# Patient Record
Sex: Male | Born: 1950 | Race: White | Hispanic: No | Marital: Single | Smoking: Former smoker
Health system: Southern US, Community
[De-identification: ages and names within clinical notes are randomized; demographics above are authoritative.]

## PROBLEM LIST (undated history)

## (undated) DIAGNOSIS — E119 Type 2 diabetes mellitus without complications: Secondary | ICD-10-CM

## (undated) DIAGNOSIS — E785 Hyperlipidemia, unspecified: Secondary | ICD-10-CM

## (undated) DIAGNOSIS — G4730 Sleep apnea, unspecified: Secondary | ICD-10-CM

## (undated) DIAGNOSIS — E669 Obesity, unspecified: Secondary | ICD-10-CM

## (undated) DIAGNOSIS — K219 Gastro-esophageal reflux disease without esophagitis: Secondary | ICD-10-CM

## (undated) HISTORY — DX: Sleep apnea, unspecified: G47.30

## (undated) HISTORY — DX: Type 2 diabetes mellitus without complications: E11.9

## (undated) HISTORY — DX: Hyperlipidemia, unspecified: E78.5

## (undated) HISTORY — DX: Gastro-esophageal reflux disease without esophagitis: K21.9

## (undated) HISTORY — PX: NECK SURGERY: SHX720

## (undated) HISTORY — DX: Obesity, unspecified: E66.9

## (undated) HISTORY — PX: LEG SURGERY: SHX1003

## (undated) HISTORY — PX: HAND SURGERY: SHX662

---

## 2013-02-25 ENCOUNTER — Encounter: Payer: 59 | Admitting: Dietician

## 2013-02-25 ENCOUNTER — Encounter: Payer: 59 | Attending: Emergency Medicine | Admitting: Dietician

## 2013-02-25 VITALS — Ht 67.0 in | Wt 216.3 lb

## 2013-02-25 DIAGNOSIS — Z713 Dietary counseling and surveillance: Secondary | ICD-10-CM | POA: Insufficient documentation

## 2013-02-25 DIAGNOSIS — E119 Type 2 diabetes mellitus without complications: Secondary | ICD-10-CM | POA: Insufficient documentation

## 2013-02-27 ENCOUNTER — Encounter: Payer: Self-pay | Admitting: Dietician

## 2013-02-27 NOTE — Progress Notes (Signed)
  Medical Nutrition Therapy:  Appt start time: 0800 end time:  0830.   Assessment:  Primary concerns today: Comes today for blood glucose monitoring.  New Onset DM Type 2 Provided and Accu-Chek [employer] meter Kit. Lot [id number] Exp: 04/27/2014 Completed review of Blood Glucose Monitoring Process. On return demonstration his blood glucose fasting but after 1 cup black coffee with light milk was 190 mg/dl. Advised to call MD office for prescription for strips and lancets. Recommended he monitor glucose levels 2 times per day.  (vary testing times between: fasting, 2 hour post-meal (2 hours after 1 st bite of a Meal) and bedtime.  Record blood glucose levels and take glucose log to all MD/clinic appointments.   Progress Towards Goal(s):  No progress.   Nutritional Diagnosis:  [place]-2.1 Inpaired nutrition utilization As related to glucose.  As evidenced by new diagnosis of type 2 diabetes with early AM glucose at 190 mg/dl and E4V at 4.0%.    Intervention:  BGM education.  Handouts given during visit include:  Accu Chek Glucose meter  Monitoring/Evaluation:  Dietary intake, exercise, blood glucose readings, and body weight at [facility] DM Core Class 2 or 1:1 follow-up.

## 2013-02-27 NOTE — Progress Notes (Signed)
Patient was seen on 02/25/2013 for the first of a series of three diabetes self-management courses at [facility]. Patient's most recent A1c was 8.4 % on 02/05/2013 The following learning objectives were met by the patient during this course:   Defines the role of glucose and insulin  Identifies type of diabetes and pathophysiology  Defines the diagnostic criteria for diabetes and prediabetes  States the risk factors for Type 2 Diabetes  States the symptoms of Type 2 Diabetes  Defines Type 2 Diabetes treatment goals  Defines Type 2 Diabetes treatment options  States the rationale for glucose monitoring  Identifies A1C, glucose targets, and testing times  Identifies proper sharps disposal  Defines the purpose of a diabetes food plan  Identifies carbohydrate food groups  Defines effects of carbohydrate foods on glucose levels  Identifies carbohydrate choices/grams/food labels  States benefits of physical activity and effect on glucose  Review of suggested activity guidelines  Handouts given during class include:  Type 2 Diabetes: Basics Book  My Food Plan Book  Food and Activity Log   Follow-Up Plan: Core Class 2  If his insurance will pay for classes.  Will work with him 1:1 if needed.

## 2014-11-27 ENCOUNTER — Other Ambulatory Visit: Payer: Self-pay | Admitting: Gastroenterology

## 2014-11-27 DIAGNOSIS — R1013 Epigastric pain: Secondary | ICD-10-CM

## 2014-12-04 ENCOUNTER — Ambulatory Visit
Admission: RE | Admit: 2014-12-04 | Discharge: 2014-12-04 | Disposition: A | Discharge status: Home / Self Care | Payer: Medicare Other | Source: Ambulatory Visit | Attending: Gastroenterology | Admitting: Gastroenterology

## 2014-12-04 DIAGNOSIS — R1013 Epigastric pain: Secondary | ICD-10-CM

## 2017-04-10 ENCOUNTER — Other Ambulatory Visit (HOSPITAL_BASED_OUTPATIENT_CLINIC_OR_DEPARTMENT_OTHER): Payer: Self-pay

## 2017-04-10 DIAGNOSIS — R454 Irritability and anger: Secondary | ICD-10-CM

## 2017-04-10 DIAGNOSIS — R0683 Snoring: Secondary | ICD-10-CM

## 2017-04-10 DIAGNOSIS — R5383 Other fatigue: Secondary | ICD-10-CM

## 2017-04-10 DIAGNOSIS — G4710 Hypersomnia, unspecified: Secondary | ICD-10-CM

## 2017-05-17 ENCOUNTER — Ambulatory Visit (HOSPITAL_BASED_OUTPATIENT_CLINIC_OR_DEPARTMENT_OTHER): Payer: Medicare Other | Attending: Nurse Practitioner | Admitting: Internal Medicine

## 2017-05-17 VITALS — Ht 67.0 in | Wt 210.0 lb

## 2017-05-17 DIAGNOSIS — R0683 Snoring: Secondary | ICD-10-CM | POA: Insufficient documentation

## 2017-05-17 DIAGNOSIS — R454 Irritability and anger: Secondary | ICD-10-CM

## 2017-05-17 DIAGNOSIS — R5383 Other fatigue: Secondary | ICD-10-CM | POA: Insufficient documentation

## 2017-05-17 DIAGNOSIS — G4733 Obstructive sleep apnea (adult) (pediatric): Secondary | ICD-10-CM

## 2017-05-17 DIAGNOSIS — G4710 Hypersomnia, unspecified: Secondary | ICD-10-CM | POA: Diagnosis present

## 2017-05-25 NOTE — Procedures (Signed)
Patient Name: Darryl Mosley, Darryl Mosley Date: 05/17/2017 Gender: Male D.O.B: 09/10/1951 Age (years): 65 Referring Provider: Joyce Copa Height (inches): 34 Interpreting Physician: Baird Lyons MD, ABSM Weight (lbs): 210 RPSGT: Madelon Lips BMI: 33 MRN: [id number] Neck Size: 17.00 CLINICAL INFORMATION Sleep Study Type: Split Night CPAP  Indication for sleep study: Excessive Daytime Sleepiness, Fatigue, Snoring  Epworth Sleepiness Score: 2  SLEEP STUDY TECHNIQUE As per the AASM Manual for the Scoring of Sleep and Associated Events v2.3 (April 2016) with a hypopnea requiring 4% desaturations.  The channels recorded and monitored were frontal, central and occipital EEG, electrooculogram (EOG), submentalis EMG (chin), nasal and oral airflow, thoracic and abdominal wall motion, anterior tibialis EMG, snore microphone, electrocardiogram, and pulse oximetry. Continuous positive airway pressure (CPAP) was initiated when the patient met split night criteria and was titrated according to treat sleep-disordered breathing.  MEDICATIONS Medications self-administered by patient taken the night of the study : none reported  RESPIRATORY PARAMETERS Diagnostic  Total AHI (/hr): 30.0 RDI (/hr): 43.6 OA Index (/hr): 9.1 CA Index (/hr): 0.3 REM AHI (/hr): 49.4 NREM AHI (/hr): 14.4 Supine AHI (/hr): 60.0 Non-supine AHI (/hr): 26.89 Min O2 Sat (%): 77.00 Mean O2 (%): 91.70 Time below 88% (min): 23.8   Titration  Optimal Pressure (cm): 8 AHI at Optimal Pressure (/hr): 0.0 Min O2 at Optimal Pressure (%): 91.0 Supine % at Optimal (%): 0 Sleep % at Optimal (%): 95   SLEEP ARCHITECTURE The recording time for the entire night was 406.9 minutes.  During a baseline period of 224.9 minutes, the patient slept for 172.2 minutes in REM and nonREM, yielding a sleep efficiency of 76.6%. Sleep onset after lights out was 11.7 minutes with a REM latency of 108.0 minutes. The patient spent 20.61% of the night in  stage N1 sleep, 34.96% in stage N2 sleep, 0.00% in stage N3 and 44.42% in REM.  During the titration period of 172.8 minutes, the patient slept for 148.5 minutes in REM and nonREM, yielding a sleep efficiency of 85.9%. Sleep onset after CPAP initiation was 6.1 minutes with a REM latency of 72.0 minutes. The patient spent 14.81% of the night in stage N1 sleep, 69.36% in stage N2 sleep, 0.00% in stage N3 and 15.82% in REM.  CARDIAC DATA The 2 lead EKG demonstrated sinus rhythm. The mean heart rate was 71.73 beats per minute. Other EKG findings include: None.  LEG MOVEMENT DATA The total Periodic Limb Movements of Sleep (PLMS) were 32. The PLMS index was 5.94 .  IMPRESSIONS - Severe obstructive sleep apnea occurred during the diagnostic portion of the study (AHI = 30.0/hour). An optimal PAP pressure was selected for this patient ( 8 cm of water) - No significant central sleep apnea occurred during the diagnostic portion of the study (CAI = 0.3/hour). - Moderate oxygen desaturation was noted during the diagnostic portion of the study (Min O2 =77.00%). - The patient snored with Loud snoring volume during the diagnostic portion of the study. - No cardiac abnormalities were noted during this study. - Mild periodic limb movements of sleep occurred during the study.  DIAGNOSIS - Obstructive Sleep Apnea (327.23 [G47.33 ICD-10])  RECOMMENDATIONS - Trial of CPAP therapy on 8 cm H2O with a Medium size Resmed Nasal Mask Airfit N20 mask and heated humidification. - Avoid alcohol, sedatives and other CNS depressants that may worsen sleep apnea and disrupt normal sleep architecture. - Sleep hygiene should be reviewed to assess factors that may improve sleep quality. - Weight management and regular  exercise should be initiated or continued.  [Electronically signed] 07/01/2017 09:35 AM  Baird Lyons MD, ABSM Diplomate, American Board of Sleep Medicine   NPI: [id number]  [place],  American Board of Sleep Medicine  ELECTRONICALLY SIGNED ON:  05/25/2017, 9:26 AM [place] PH: [phone]   FX: [phone] [place]

## 2017-07-01 DIAGNOSIS — G4733 Obstructive sleep apnea (adult) (pediatric): Secondary | ICD-10-CM | POA: Diagnosis not present

## 2017-09-02 ENCOUNTER — Encounter: Payer: Self-pay | Admitting: Pulmonary Disease

## 2017-09-02 ENCOUNTER — Ambulatory Visit (INDEPENDENT_AMBULATORY_CARE_PROVIDER_SITE_OTHER): Payer: Medicare Other | Admitting: Pulmonary Disease

## 2017-09-02 DIAGNOSIS — R05 Cough: Secondary | ICD-10-CM | POA: Diagnosis not present

## 2017-09-02 DIAGNOSIS — G4733 Obstructive sleep apnea (adult) (pediatric): Secondary | ICD-10-CM | POA: Insufficient documentation

## 2017-09-02 DIAGNOSIS — Z9989 Dependence on other enabling machines and devices: Secondary | ICD-10-CM

## 2017-09-02 DIAGNOSIS — R053 Chronic cough: Secondary | ICD-10-CM | POA: Insufficient documentation

## 2017-09-02 MED ORDER — PANTOPRAZOLE SODIUM 40 MG PO TBEC: 40.0000 mg | DELAYED_RELEASE_TABLET | Freq: Every day | ORAL | 2 refills | Status: DC

## 2017-09-02 NOTE — Assessment & Plan Note (Signed)
cough may be related to reflux or to sinus drip  Protonix 40 mg daily for 6 weeks Schedule breathing test -full PFTs. Schedule swallowing test to look for stricture/narrowing of your food pipe  If limited improvement in cough, will sequentially treat for postnasal drip

## 2017-09-02 NOTE — Patient Instructions (Signed)
Your cough may be related to reflux or to sinus drip  Protonix 40 mg daily for 6 weeks Schedule breathing test -full PFTs. Schedule swallowing test to look for stricture/narrowing of your food pipe
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
## 2017-09-02 NOTE — Assessment & Plan Note (Signed)
CPAP 8 cm seems to be working well He does have residual snoring and may have to increase pressure  Weight loss encouraged, compliance with goal of at least 4-6 hrs every night is the expectation. Advised against medications with sedative side effects Cautioned against driving when sleepy - understanding that sleepiness will vary on a day to day basis

## 2017-09-08 ENCOUNTER — Ambulatory Visit (HOSPITAL_COMMUNITY)
Admission: RE | Admit: 2017-09-08 | Discharge: 2017-09-08 | Disposition: A | Discharge status: Home / Self Care | Payer: Medicare Other | Source: Ambulatory Visit | Attending: Pulmonary Disease | Admitting: Pulmonary Disease

## 2017-09-08 DIAGNOSIS — K449 Diaphragmatic hernia without obstruction or gangrene: Secondary | ICD-10-CM | POA: Insufficient documentation

## 2017-09-08 DIAGNOSIS — R1310 Dysphagia, unspecified: Secondary | ICD-10-CM | POA: Insufficient documentation

## 2017-09-08 DIAGNOSIS — K219 Gastro-esophageal reflux disease without esophagitis: Secondary | ICD-10-CM | POA: Diagnosis not present

## 2017-09-08 DIAGNOSIS — R053 Chronic cough: Secondary | ICD-10-CM

## 2017-09-08 DIAGNOSIS — R05 Cough: Secondary | ICD-10-CM | POA: Insufficient documentation

## 2017-09-22 ENCOUNTER — Institutional Professional Consult (permissible substitution): Payer: Medicare Other | Admitting: Internal Medicine

## 2017-10-20 IMAGING — RF DG ESOPHAGUS
13 series · 16 of 24 positions shown · non-contrast
Comparison: None.

CLINICAL DATA: Chronic cough for 2 years. History of pneumonia.
Diabetes. Gastroesophageal reflux disease. Ex-smoker.

EXAM:
ESOPHOGRAM / BARIUM SWALLOW / BARIUM TABLET STUDY
TECHNIQUE: Combined double contrast and single contrast examination performed
using effervescent crystals, thick barium liquid, and thin barium
liquid. The patient was observed with fluoroscopy swallowing a 13 mm
barium sulphate tablet.
FLUOROSCOPY TIME:  Fluoroscopy Time:  3 minutes and 18 seconds
Radiation Exposure Index (if provided by the fluoroscopic device):
Not applicable.
Number of Acquired Spot Images: 0

[Series 1: cp_standard · 0.17mm/px · 1 of 1 slices shown (1 of 13)]
[im 1/1]
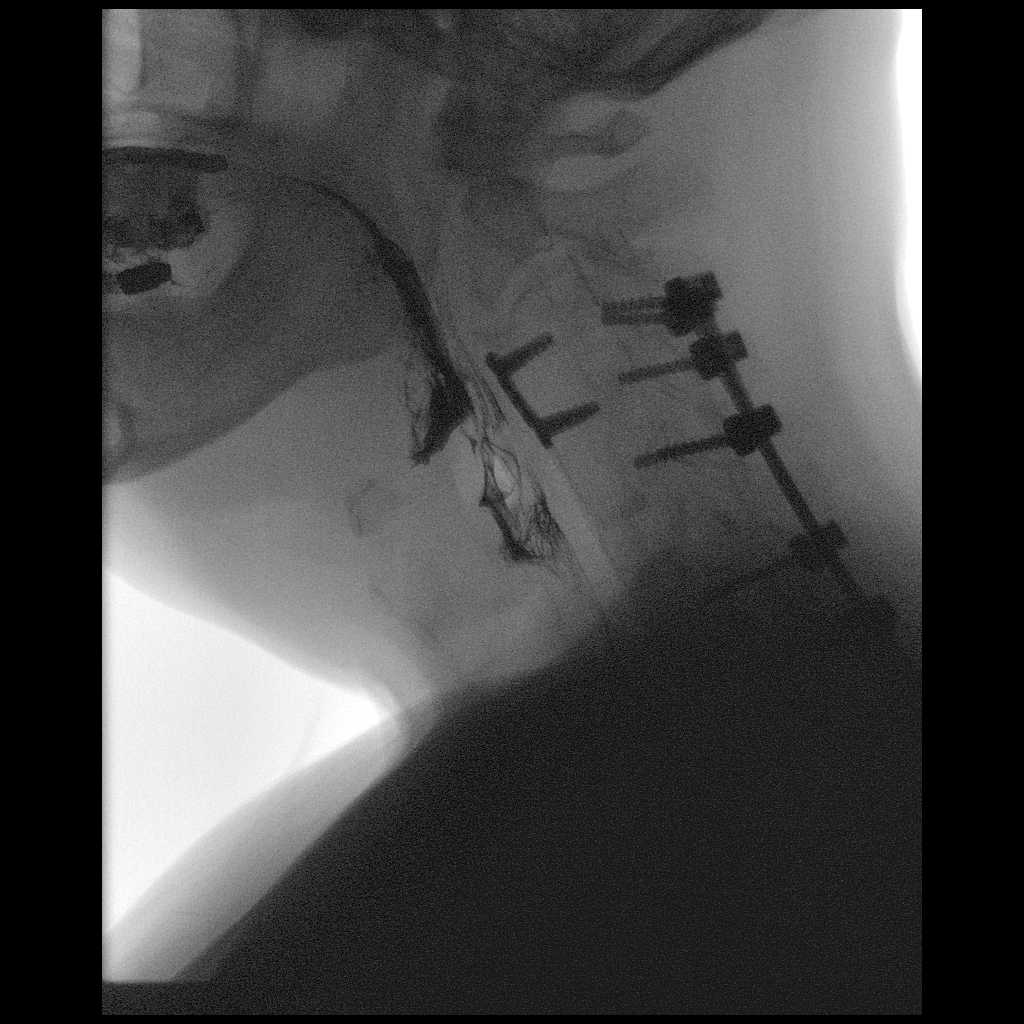

[Series 2: cp_standard · 0.51mm/px · 1 of 35 frames shown (2 of 13)]
[frame 34/35]
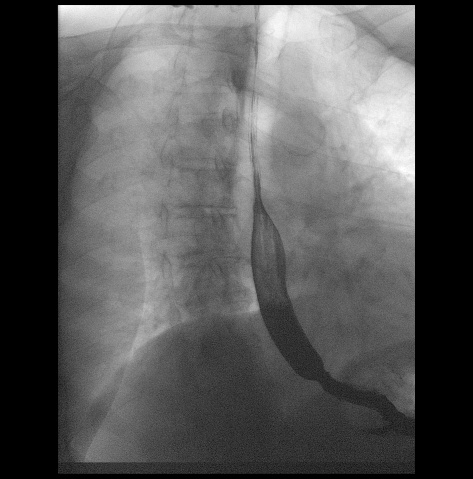

[Series 3: cp_standard · 0.51mm/px · 1 of 99 frames shown (3 of 13)]
[frame 24/99]
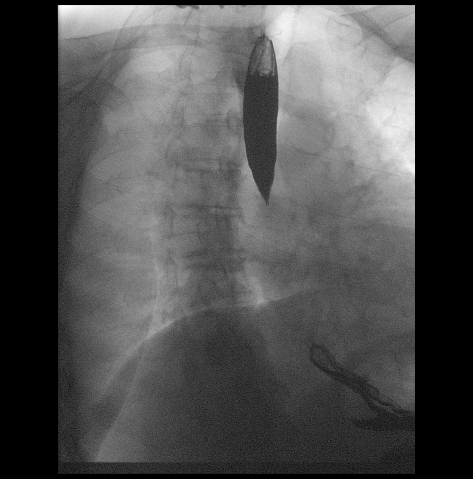

[Series 4: cp_standard · 0.51mm/px · 2 of 152 frames shown (4 of 13)]
[frame 77/152]
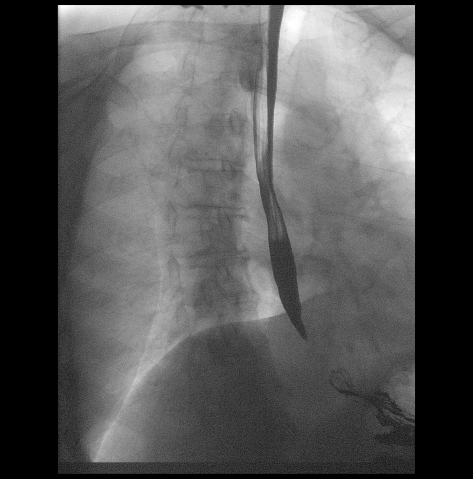
[frame 130/152]
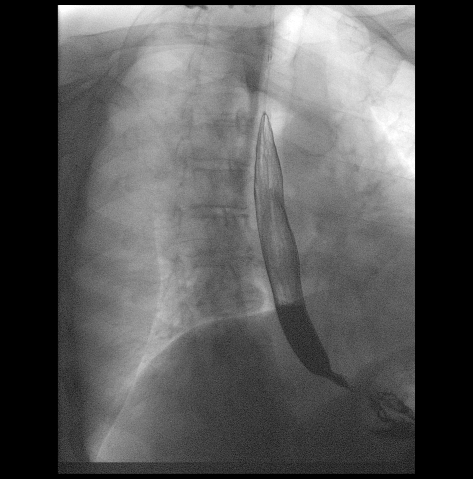

[Series 5: cp_standard · 0.52mm/px · 1 of 120 frames shown (5 of 13)]
[frame 108/120]
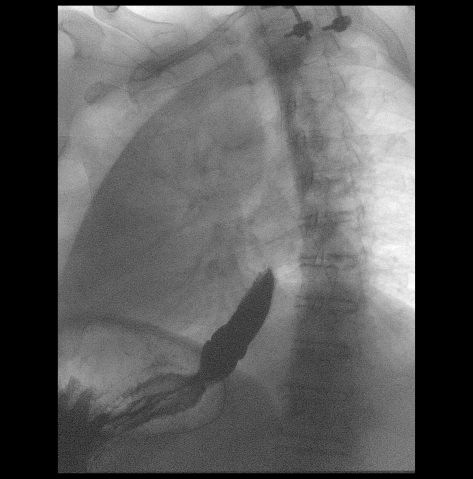

[Series 6: cp_standard · 0.52mm/px · 1 of 181 frames shown (6 of 13)]
[frame 91/181]
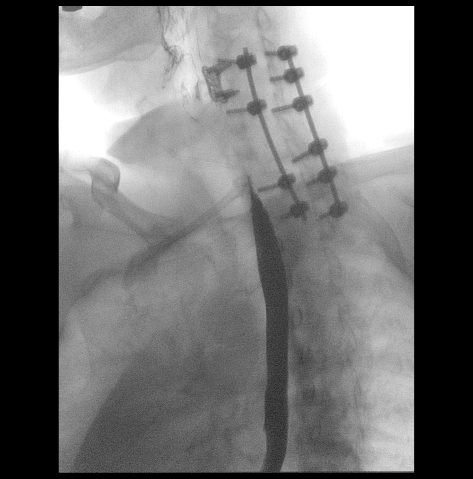

[Series 7: cp_standard · 0.52mm/px · 2 of 277 frames shown (7 of 13)]
[frame 139/277]
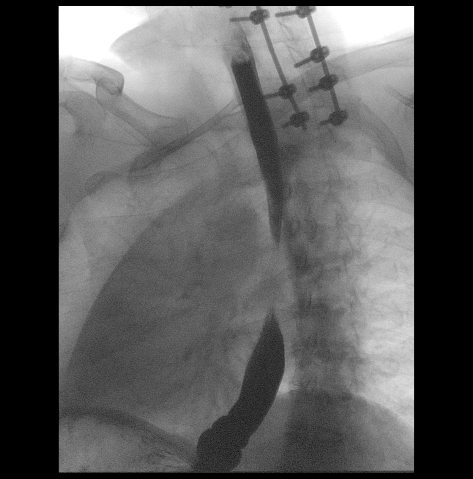
[frame 236/277]
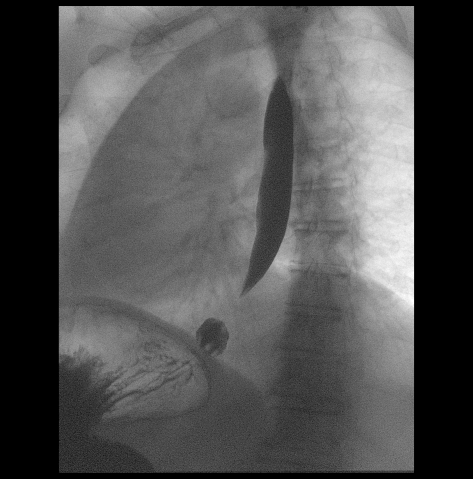

[Series 8: cp_standard · 0.53mm/px · 1 of 45 frames shown (8 of 13)]
[frame 45/45]
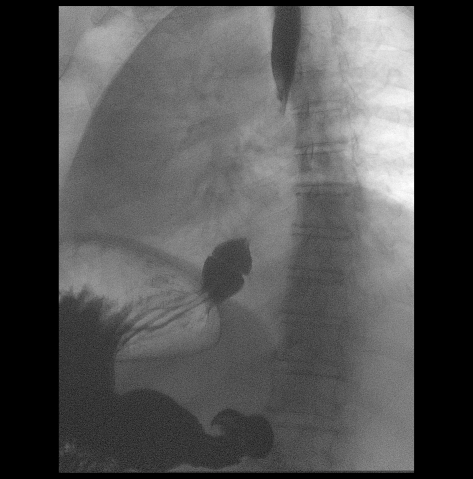

[Series 9: cp_standard · 0.53mm/px · 1 of 91 frames shown (9 of 13)]
[frame 15/91]
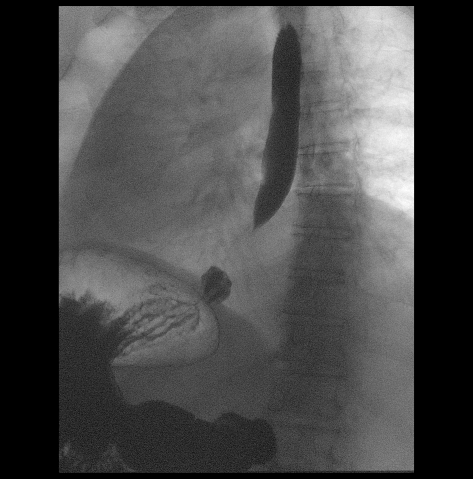

[Series 10: cp_standard · 0.53mm/px · 2 of 221 frames shown (10 of 13)]
[frame 87/221]
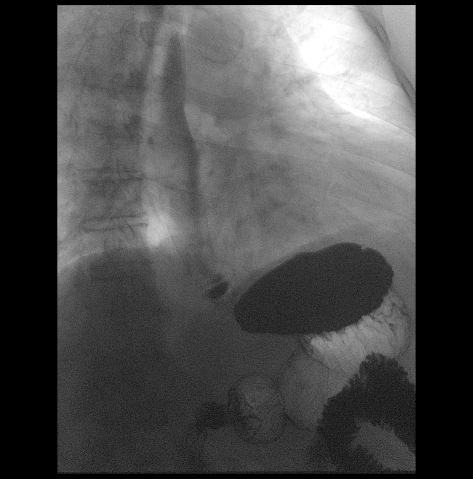
[frame 188/221]
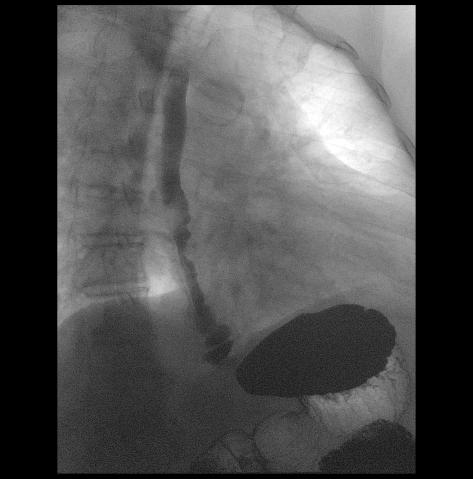

[Series 11: cp_standard · 0.53mm/px · 1 of 57 frames shown (11 of 13)]
[frame 49/57]
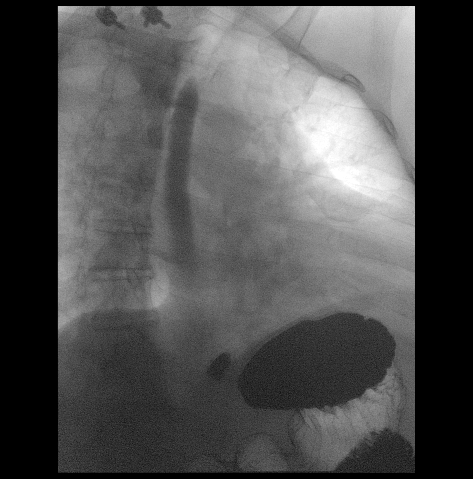

[Series 12: cp_standard · 0.53mm/px · 1 of 242 frames shown (12 of 13)]
[frame 37/242]
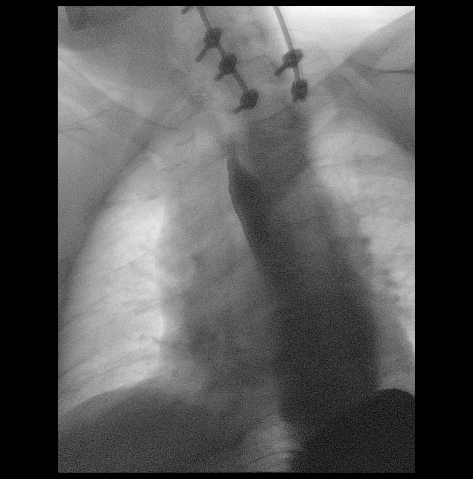

[Series 14: cp_standard · 0.25mm/px · 1 of 1 slices shown (13 of 13)]
[im 1/1]
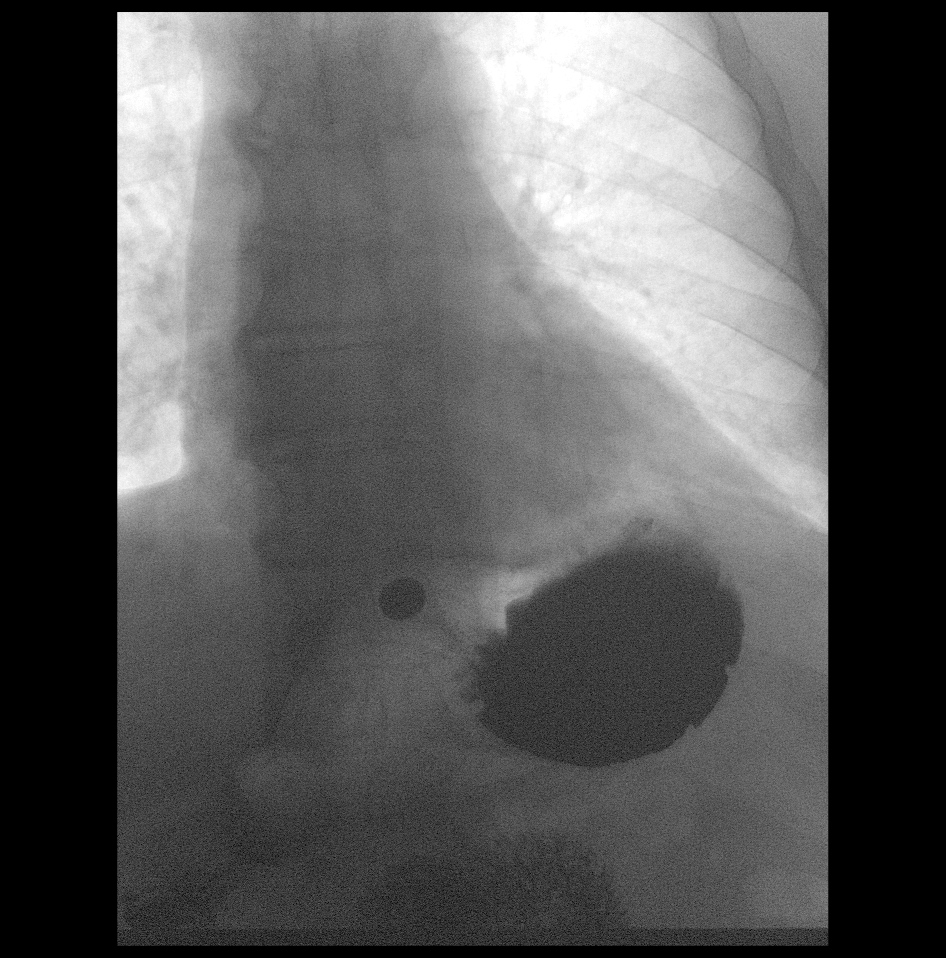

[16 of 24 positions shown; findings below may reference images not displayed]

FINDINGS: Hypopharyngeal portion of the exam is unremarkable.

Double contrast evaluation of the esophagus demonstrates no mucosal
abnormality.

Evaluation of primary peristalsis demonstrates a normal primary
peristaltic wave on each swallow.

Small hiatal hernia.

With water siphon test, there is gastroesophageal reflux to the
upper thoracic esophagus. Example series 10.

Delayed passage of a barium tablet within the distal esophagus.
IMPRESSION: 1. Small hiatal hernia.
2. Gastroesophageal reflux   with "Water siphon" test.
3. Delayed passage of a barium tablet at the distal esophagus or
gastroesophageal junction. No cause identified.

## 2017-10-21 ENCOUNTER — Ambulatory Visit (INDEPENDENT_AMBULATORY_CARE_PROVIDER_SITE_OTHER): Payer: Medicare Other | Admitting: Pulmonary Disease

## 2017-10-21 ENCOUNTER — Encounter: Payer: Self-pay | Admitting: Pulmonary Disease

## 2017-10-21 DIAGNOSIS — R053 Chronic cough: Secondary | ICD-10-CM

## 2017-10-21 DIAGNOSIS — Z23 Encounter for immunization: Secondary | ICD-10-CM | POA: Diagnosis not present

## 2017-10-21 DIAGNOSIS — Z9989 Dependence on other enabling machines and devices: Secondary | ICD-10-CM | POA: Diagnosis not present

## 2017-10-21 DIAGNOSIS — G4733 Obstructive sleep apnea (adult) (pediatric): Secondary | ICD-10-CM | POA: Diagnosis not present

## 2017-10-21 DIAGNOSIS — R05 Cough: Secondary | ICD-10-CM

## 2017-10-21 LAB — PULMONARY FUNCTION TEST
DL/VA % pred: 96 %
DL/VA: 4.25 ml/min/mmHg/L
DLCO UNC: 27.2 ml/min/mmHg
DLCO cor % pred: 94 %
DLCO cor: 26.62 ml/min/mmHg
DLCO unc % pred: 96 %
FEF 25-75 Post: 3.81 L/sec
FEF 25-75 Pre: 3.39 L/sec
FEF2575-%Change-Post: 12 %
FEF2575-%Pred-Post: 152 %
FEF2575-%Pred-Pre: 136 %
FEV1-%Change-Post: 2 %
FEV1-%PRED-POST: 116 %
FEV1-%Pred-Pre: 114 %
FEV1-POST: 3.61 L
FEV1-PRE: 3.52 L
FEV1FVC-%Change-Post: 3 %
FEV1FVC-%Pred-Pre: 106 %
FEV6-%Change-Post: 0 %
FEV6-%Pred-Post: 111 %
FEV6-%Pred-Pre: 111 %
FEV6-POST: 4.36 L
FEV6-Pre: 4.39 L
FEV6FVC-%Change-Post: 0 %
FEV6FVC-%PRED-POST: 105 %
FEV6FVC-%PRED-PRE: 104 %
FVC-%CHANGE-POST: -1 %
FVC-%Pred-Post: 105 %
FVC-%Pred-Pre: 106 %
FVC-Post: 4.36 L
FVC-Pre: 4.41 L
POST FEV6/FVC RATIO: 100 %
PRE FEV6/FVC RATIO: 99 %
Post FEV1/FVC ratio: 83 %
Pre FEV1/FVC ratio: 80 %
RV % pred: 98 %
RV: 2.13 L
TLC % PRED: 98 %
TLC: 6.32 L

## 2017-10-21 NOTE — Patient Instructions (Signed)
Flu shot today. Increase CPAP to 10 cm. Stay on Protonix for 3 months with refills 3.  For sinus drip, trial of [employer] brand Sudafed [phenylephrine] once daily for 4 weeks Chlorpheniramine ([employer] brand] 8 mg at bedtime for 4 weeks
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
## 2017-10-21 NOTE — Patient Instructions (Signed)
PFT done today. 

## 2017-10-21 NOTE — Assessment & Plan Note (Signed)
Increase CPAP to 10 cm. for snoring. Although events seem to be controlled on 8 cm  Weight loss encouraged, compliance with goal of at least 4-6 hrs every night is the expectation. Advised against medications with sedative side effects Cautioned against driving when sleepy - understanding that sleepiness will vary on a day to day basis

## 2017-10-21 NOTE — Assessment & Plan Note (Signed)
Flu shot today. Stay on Protonix for 3 months with refills 3.  For sinus drip, trial of [employer] brand Sudafed [phenylephrine] once daily for 4 weeks Chlorpheniramine ([facility] brand] 8 mg at bedtime for 4 weeks

## 2017-10-22 DIAGNOSIS — Z23 Encounter for immunization: Secondary | ICD-10-CM | POA: Diagnosis not present

## 2017-10-22 MED ORDER — PHENYLEPHRINE-CHLORPHEN-DM 3.5-1-3 MG/ML PO LIQD: 8.0000 mL | Freq: Every day | ORAL | 0 refills | Status: AC

## 2017-10-22 MED ORDER — PANTOPRAZOLE SODIUM 20 MG PO TBEC: 20.0000 mg | DELAYED_RELEASE_TABLET | Freq: Every day | ORAL | 3 refills | Status: DC

## 2017-10-22 NOTE — Addendum Note (Signed)
Addended by: Cornell BarmanWHITTAKER, Clebert Wenger M on: 10/22/2017 08:50 AM   Modules accepted: Orders

## 2017-11-23 ENCOUNTER — Other Ambulatory Visit: Payer: Self-pay | Admitting: Pulmonary Disease

## 2017-12-16 ENCOUNTER — Ambulatory Visit (INDEPENDENT_AMBULATORY_CARE_PROVIDER_SITE_OTHER): Payer: Medicare Other | Admitting: Pulmonary Disease

## 2017-12-16 ENCOUNTER — Encounter: Payer: Self-pay | Admitting: Pulmonary Disease

## 2017-12-16 DIAGNOSIS — G4733 Obstructive sleep apnea (adult) (pediatric): Secondary | ICD-10-CM

## 2017-12-16 DIAGNOSIS — Z9989 Dependence on other enabling machines and devices: Secondary | ICD-10-CM

## 2017-12-16 DIAGNOSIS — R05 Cough: Secondary | ICD-10-CM

## 2017-12-16 DIAGNOSIS — R053 Chronic cough: Secondary | ICD-10-CM

## 2017-12-16 NOTE — Assessment & Plan Note (Signed)
CPAP is set at 10 cm and seems to be working well  Weight loss encouraged, compliance with goal of at least 4-6 hrs every night is the expectation. Advised against medications with sedative side effects Cautioned against driving when sleepy - understanding that sleepiness will vary on a day to day basis
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
## 2017-12-16 NOTE — Patient Instructions (Signed)
Use decongestant for sinus drip on an as-needed basis. CPAP is set at 10 cm and seems to be working well

## 2017-12-16 NOTE — Assessment & Plan Note (Signed)
Has responded very well to combination of PPI and sinus decongestant Protonix 20 mg seems to be working well   Use decongestant for sinus drip on an as-needed basis.

## 2017-12-17 ENCOUNTER — Other Ambulatory Visit: Payer: Self-pay

## 2017-12-17 MED ORDER — PANTOPRAZOLE SODIUM 40 MG PO TBEC: 40.0000 mg | DELAYED_RELEASE_TABLET | Freq: Every day | ORAL | 1 refills | Status: DC

## 2018-02-09 ENCOUNTER — Other Ambulatory Visit: Payer: Self-pay | Admitting: Internal Medicine

## 2018-06-09 ENCOUNTER — Other Ambulatory Visit: Payer: Self-pay | Admitting: Pulmonary Disease

## 2018-12-28 ENCOUNTER — Other Ambulatory Visit: Payer: Self-pay | Admitting: Orthopedic Surgery

## 2018-12-28 DIAGNOSIS — M25551 Pain in right hip: Secondary | ICD-10-CM

## 2023-05-14 ENCOUNTER — Other Ambulatory Visit (HOSPITAL_COMMUNITY): Payer: Self-pay

## 2023-05-14 MED ORDER — MOUNJARO 5 MG/0.5ML ~~LOC~~ SOAJ
5.0000 mg | SUBCUTANEOUS | 5 refills | Status: AC
  Filled 2023-05-14: qty 2, 28d supply, fill #0

## 2023-06-09 ENCOUNTER — Other Ambulatory Visit (HOSPITAL_BASED_OUTPATIENT_CLINIC_OR_DEPARTMENT_OTHER): Payer: Self-pay

## 2023-06-09 MED ORDER — MOUNJARO 5 MG/0.5ML ~~LOC~~ SOAJ
5.0000 mg | SUBCUTANEOUS | 5 refills | Status: AC
  Filled 2023-06-09: qty 2, 28d supply, fill #0
  Filled 2023-07-06: qty 2, 28d supply, fill #1
  Filled 2023-08-03: qty 2, 28d supply, fill #2
  Filled 2023-08-31: qty 2, 28d supply, fill #3
  Filled 2023-09-29: qty 2, 28d supply, fill #4

## 2023-07-06 ENCOUNTER — Other Ambulatory Visit (HOSPITAL_BASED_OUTPATIENT_CLINIC_OR_DEPARTMENT_OTHER): Payer: Self-pay

## 2023-08-03 ENCOUNTER — Other Ambulatory Visit (HOSPITAL_BASED_OUTPATIENT_CLINIC_OR_DEPARTMENT_OTHER): Payer: Self-pay

## 2023-08-04 ENCOUNTER — Other Ambulatory Visit (HOSPITAL_BASED_OUTPATIENT_CLINIC_OR_DEPARTMENT_OTHER): Payer: Self-pay

## 2023-09-01 ENCOUNTER — Other Ambulatory Visit (HOSPITAL_BASED_OUTPATIENT_CLINIC_OR_DEPARTMENT_OTHER): Payer: Self-pay

## 2023-09-01 ENCOUNTER — Other Ambulatory Visit: Payer: Self-pay

## 2023-09-29 ENCOUNTER — Other Ambulatory Visit (HOSPITAL_BASED_OUTPATIENT_CLINIC_OR_DEPARTMENT_OTHER): Payer: Self-pay

## 2023-10-21 ENCOUNTER — Other Ambulatory Visit (HOSPITAL_BASED_OUTPATIENT_CLINIC_OR_DEPARTMENT_OTHER): Payer: Self-pay

## 2023-10-21 MED ORDER — MOUNJARO 7.5 MG/0.5ML ~~LOC~~ SOAJ
7.5000 mg | SUBCUTANEOUS | 3 refills | Status: AC
  Filled 2023-10-21: qty 2, 28d supply, fill #0
  Filled 2023-11-23: qty 2, 28d supply, fill #1
  Filled 2023-12-21: qty 2, 28d supply, fill #2
  Filled 2024-01-18: qty 2, 28d supply, fill #3
  Filled 2024-02-15: qty 2, 28d supply, fill #4
  Filled 2024-03-15: qty 2, 28d supply, fill #5
  Filled 2024-04-12: qty 2, 28d supply, fill #6
  Filled 2024-05-10: qty 2, 28d supply, fill #7
  Filled 2024-06-06: qty 2, 28d supply, fill #8
  Filled 2024-07-05: qty 2, 28d supply, fill #9
  Filled 2024-08-02 – 2024-08-03 (×2): qty 2, 28d supply, fill #10
  Filled 2024-08-29: qty 2, 28d supply, fill #11

## 2023-11-23 ENCOUNTER — Other Ambulatory Visit (HOSPITAL_BASED_OUTPATIENT_CLINIC_OR_DEPARTMENT_OTHER): Payer: Self-pay

## 2023-12-22 ENCOUNTER — Other Ambulatory Visit (HOSPITAL_BASED_OUTPATIENT_CLINIC_OR_DEPARTMENT_OTHER): Payer: Self-pay

## 2024-01-18 ENCOUNTER — Other Ambulatory Visit (HOSPITAL_BASED_OUTPATIENT_CLINIC_OR_DEPARTMENT_OTHER): Payer: Self-pay

## 2024-06-07 ENCOUNTER — Other Ambulatory Visit (HOSPITAL_BASED_OUTPATIENT_CLINIC_OR_DEPARTMENT_OTHER): Payer: Self-pay

## 2024-07-06 ENCOUNTER — Other Ambulatory Visit (HOSPITAL_BASED_OUTPATIENT_CLINIC_OR_DEPARTMENT_OTHER): Payer: Self-pay

## 2024-08-03 ENCOUNTER — Other Ambulatory Visit (HOSPITAL_BASED_OUTPATIENT_CLINIC_OR_DEPARTMENT_OTHER): Payer: Self-pay

## 2024-09-28 ENCOUNTER — Other Ambulatory Visit (HOSPITAL_BASED_OUTPATIENT_CLINIC_OR_DEPARTMENT_OTHER): Payer: Self-pay

## 2024-09-28 ENCOUNTER — Other Ambulatory Visit (HOSPITAL_COMMUNITY): Payer: Self-pay

## 2024-09-28 MED ORDER — MOUNJARO 10 MG/0.5ML ~~LOC~~ SOAJ
10.0000 mg | SUBCUTANEOUS | 11 refills | Status: AC
  Filled 2024-09-28 (×2): qty 2, 28d supply, fill #0
  Filled 2024-10-25: qty 2, 28d supply, fill #1
  Filled 2024-10-26: qty 2, 28d supply, fill #0
  Filled 2024-11-21: qty 2, 28d supply, fill #1
  Filled 2024-12-19: qty 2, 28d supply, fill #2
  Filled 2025-01-17: qty 2, 28d supply, fill #3

## 2024-10-26 ENCOUNTER — Other Ambulatory Visit (HOSPITAL_BASED_OUTPATIENT_CLINIC_OR_DEPARTMENT_OTHER): Payer: Self-pay

## 2024-10-26 ENCOUNTER — Other Ambulatory Visit (HOSPITAL_COMMUNITY): Payer: Self-pay

## 2024-11-22 ENCOUNTER — Other Ambulatory Visit (HOSPITAL_BASED_OUTPATIENT_CLINIC_OR_DEPARTMENT_OTHER): Payer: Self-pay

## 2025-01-17 ENCOUNTER — Other Ambulatory Visit (HOSPITAL_BASED_OUTPATIENT_CLINIC_OR_DEPARTMENT_OTHER): Payer: Self-pay
